# Patient Record
Sex: Female | Born: 1980 | Race: White | Hispanic: No | Marital: Married | Smoking: Never smoker
Health system: Southern US, Community
[De-identification: ages and names within clinical notes are randomized; demographics above are authoritative.]

## PROBLEM LIST (undated history)

## (undated) HISTORY — PX: KNEE SURGERY: SHX244

---

## 2009-05-31 ENCOUNTER — Emergency Department: Payer: Self-pay | Admitting: Emergency Medicine

## 2011-06-27 IMAGING — CR DG CHEST 2V
1 series · 2 of 2 positions shown · non-contrast
Comparison: none

REASON FOR EXAM: COUGH, SOB
COMMENTS:

[Series 1: view not recorded · 0.17mm/px · 2 of 2 slices shown]
[im 1/2]
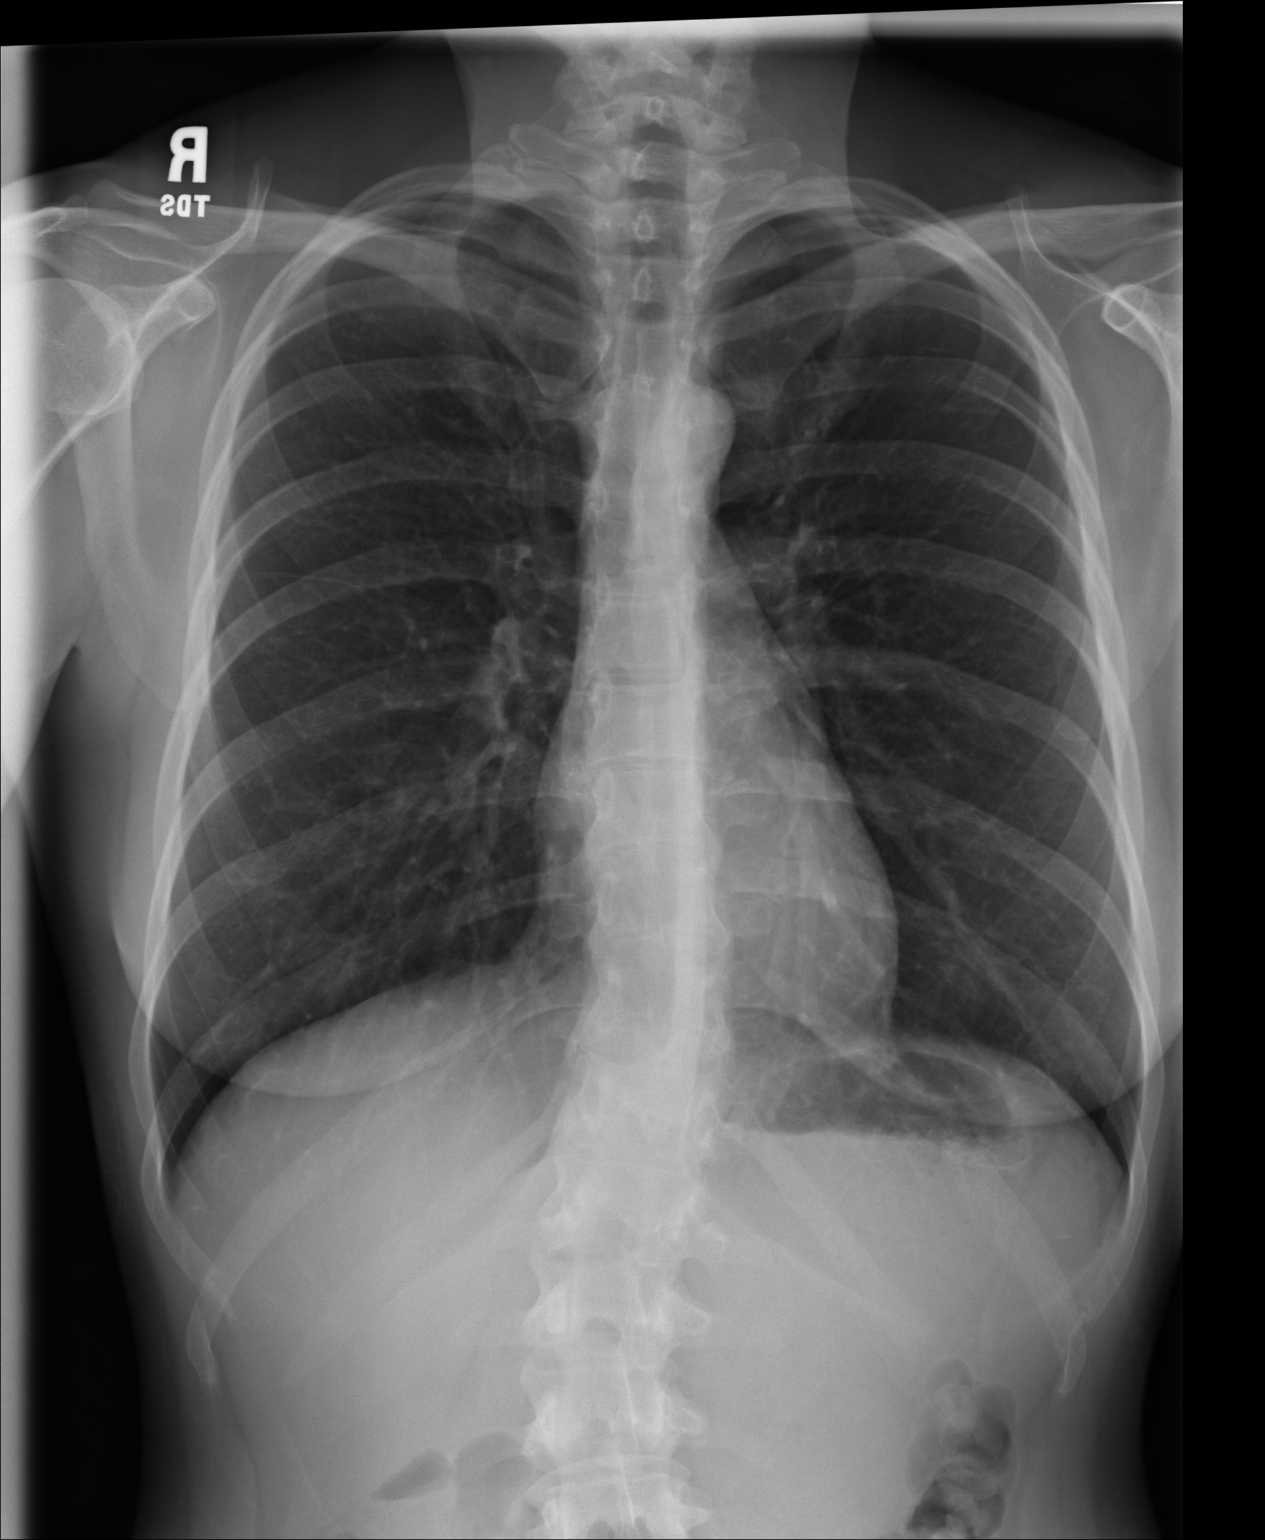
[im 2/2]
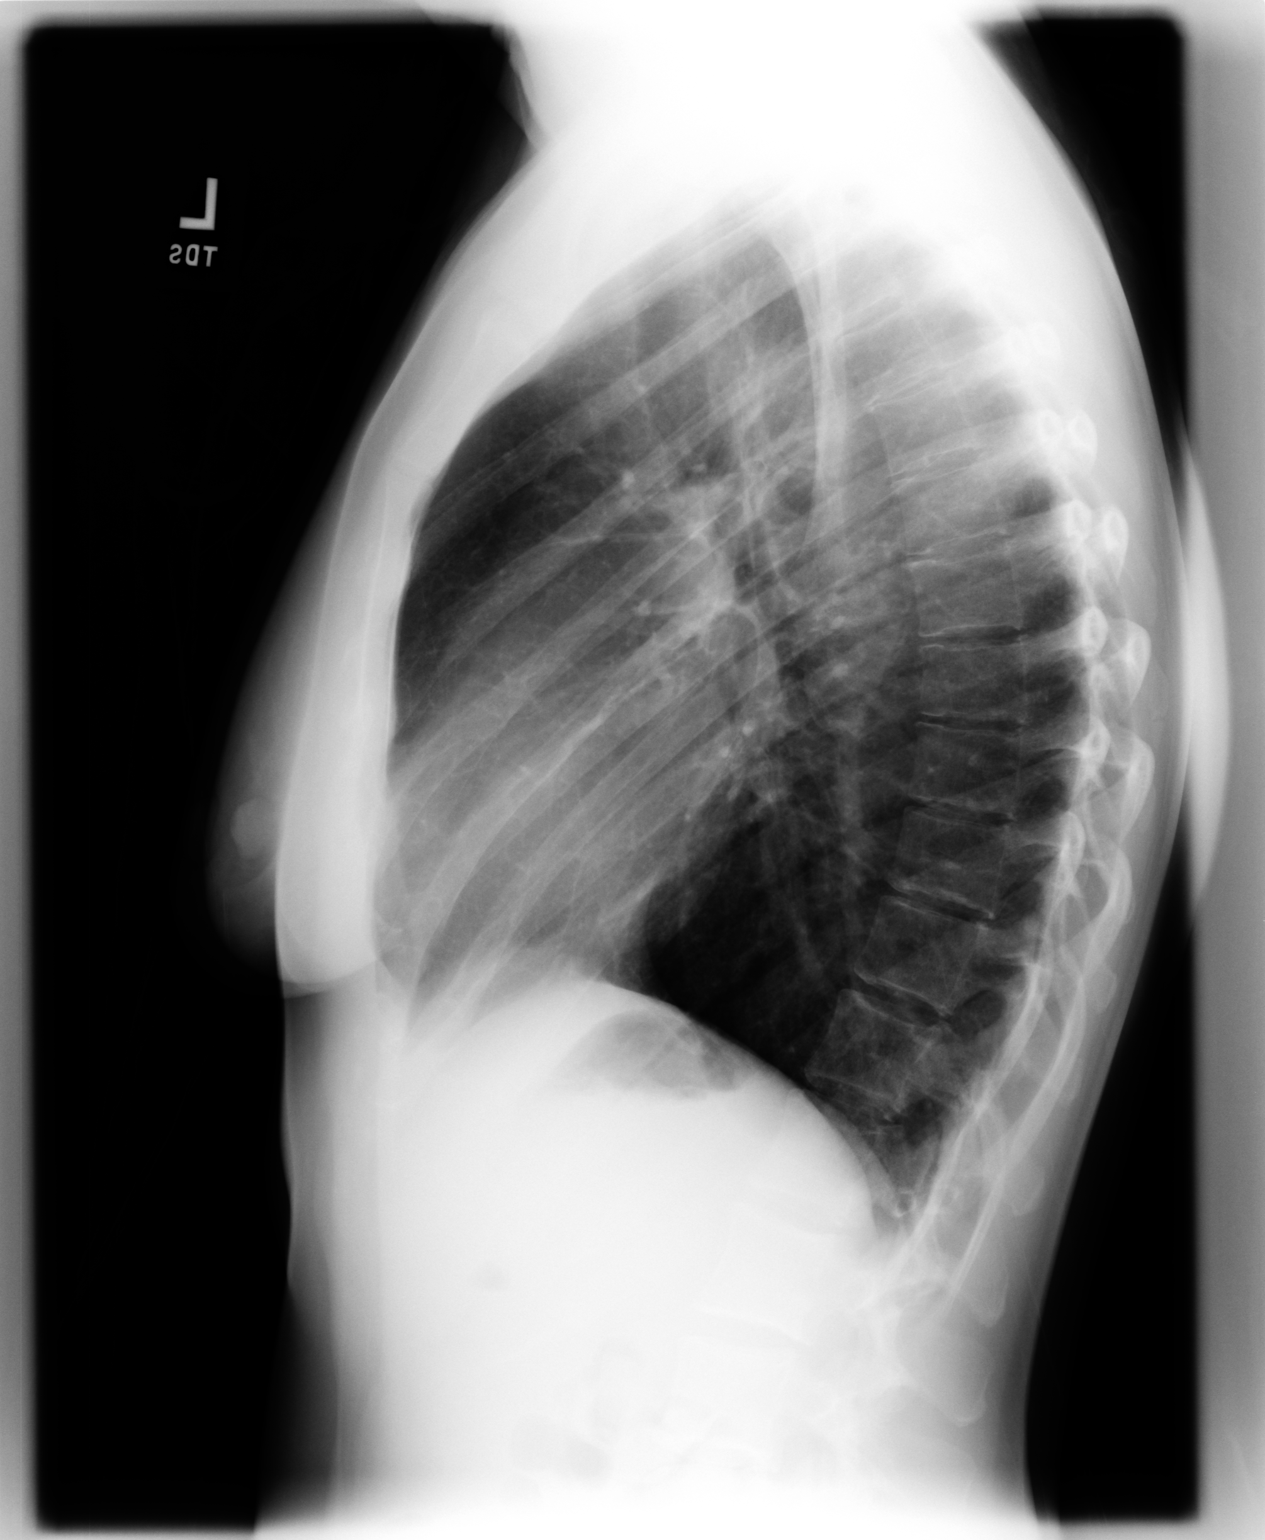

[2 of 2 positions shown; findings below may reference images not displayed]

PROCEDURE:     DXR - DXR CHEST PA (OR AP) AND LATERAL  - May 31, 2009  [DATE]

RESULT:     The lungs are mildly hyperinflated but clear. The heart is
normal in size. The pulmonary vascularity is not engorged. There is no
pleural effusion. The mediastinum is not widened. Gentle curvature of the
thoracolumbar spine is present.
IMPRESSION: There is mild hyperinflation consistent with reactive
airway disease or COPD. I do not see evidence of pneumonia.

## 2017-12-19 ENCOUNTER — Other Ambulatory Visit: Payer: Self-pay | Admitting: Student

## 2017-12-19 DIAGNOSIS — M5412 Radiculopathy, cervical region: Secondary | ICD-10-CM

## 2017-12-19 DIAGNOSIS — R29818 Other symptoms and signs involving the nervous system: Secondary | ICD-10-CM

## 2018-01-03 ENCOUNTER — Ambulatory Visit: Payer: Self-pay

## 2018-01-03 ENCOUNTER — Other Ambulatory Visit: Payer: Self-pay

## 2021-03-24 ENCOUNTER — Ambulatory Visit
Admission: EM | Admit: 2021-03-24 | Discharge: 2021-03-24 | Disposition: A | Discharge status: Home / Self Care | Payer: BC Managed Care – PPO

## 2021-03-24 ENCOUNTER — Encounter: Payer: Self-pay | Admitting: Emergency Medicine

## 2021-03-24 ENCOUNTER — Other Ambulatory Visit: Payer: Self-pay

## 2021-03-24 DIAGNOSIS — H68002 Unspecified Eustachian salpingitis, left ear: Secondary | ICD-10-CM

## 2021-03-24 MED ORDER — FLUTICASONE PROPIONATE 50 MCG/ACT NA SUSP: 2.0000 | Freq: Every day | NASAL | 0 refills | Status: AC

## 2021-03-24 NOTE — ED Provider Notes (Signed)
[facility]    CSN: [id number] Arrival date & time: 03/24/21  1720      History   Chief Complaint Chief Complaint  Patient presents with   Ear Irritation     HPI Dana Hardin is a 40 y.o. femalewho presents due to having L ear irritation since today. Denies any pain. All day has heard crackling in her L ear. Has a slight scratchy throat today, but no rhinitis, cough or nose congestion.     History reviewed. No pertinent past medical history.  There are no problems to display for this patient.   Past Surgical History:  Procedure Laterality Date   KNEE SURGERY Left     OB History   No obstetric history on file.      Home Medications    Prior to Admission medications   Medication Sig Start Date End Date Taking? Authorizing Provider  fluticasone (FLONASE) 50 MCG/ACT nasal spray Place 2 sprays into both nostrils daily. 03/24/21  Yes Rodriguez-Southworth, Nettie Elm, PA-C  levonorgestrel (MIRENA, 52 MG,) 20 MCG/DAY IUD Mirena 20 mcg/24 hours (8 yrs) 52 mg intrauterine device  Take by intrauterine route. 02/26/13  Yes [provider]    Family History History reviewed. No pertinent family history.  Social History Social History   Tobacco Use   Smoking status: Never   Smokeless tobacco: Never  Vaping Use   Vaping Use: Never used  Substance Use Topics   Alcohol use: Yes   Drug use: Never     Allergies   Shellfish-derived products, Butorphanol, and Codeine   Review of Systems Review of Systems  Constitutional:  Negative for chills and fever.  HENT:  Negative for congestion, ear discharge, ear pain, sore throat and trouble swallowing.   Eyes:  Negative for discharge.  Respiratory:  Negative for cough.   Skin:  Negative for rash.    Physical Exam Triage Vital Signs ED Triage Vitals [03/24/21 1739]  Enc Vitals Group     BP 110/62     Pulse Rate (!) 57     Resp      Temp 99 F (37.2 C)     Temp Source Oral     SpO2 98 %      Weight      Height      Head Circumference      Peak Flow      Pain Score      Pain Loc      Pain Edu?      Excl. in GC?    No data found.  Updated Vital Signs BP 110/62 (BP Location: Right Arm)   Pulse (!) 57   Temp 99 F (37.2 C) (Oral)   SpO2 98%   Visual Acuity Right Eye Distance:   Left Eye Distance:   Bilateral Distance:    Right Eye Near:   Left Eye Near:    Bilateral Near:     Physical Exam Vitals and nursing note reviewed.  Constitutional:      General: She is not in acute distress.    Appearance: She is normal weight. She is not toxic-appearing.  HENT:     Head: Normocephalic.     Right Ear: Tympanic membrane, ear canal and external ear normal.     Left Ear: Tympanic membrane and ear canal normal.  Eyes:     General: No scleral icterus.    Conjunctiva/sclera: Conjunctivae normal.  Pulmonary:     Effort: Pulmonary effort is normal.  Musculoskeletal:  Cervical back: Neck supple.  Lymphadenopathy:     Cervical: No cervical adenopathy.  Skin:    General: Skin is warm and dry.  Neurological:     Mental Status: She is alert and oriented to person, place, and time.     Gait: Gait normal.  Psychiatric:        Mood and Affect: Mood normal.        Behavior: Behavior normal.        Thought Content: Thought content normal.        Judgment: Judgment normal.     UC Treatments / Results  Labs (all labs ordered are listed, but only abnormal results are displayed) Labs Reviewed - No data to display  EKG   Radiology No results found.  Procedures Procedures (including critical care time)  Medications Ordered in UC Medications - No data to display  Initial Impression / Assessment and Plan / UC Course  I have reviewed the triage vital signs and the nursing notes. L eustachian tube dysfunction I placed her on Flonase as noted.    Final Clinical Impressions(s) / UC Diagnoses   Final diagnoses:  Eustachian catarrh, left   Discharge  Instructions   None    ED Prescriptions     Medication Sig Dispense Auth. Provider   fluticasone (FLONASE) 50 MCG/ACT nasal spray Place 2 sprays into both nostrils daily. 18 g Rodriguez-Southworth, Nettie Elm, PA-C      PDMP not reviewed this encounter.   Garey Ham, PA-C 03/24/21 1820

## 2021-03-24 NOTE — ED Triage Notes (Signed)
Pt c/o left ear irritation that started today. Pt denies any pain.

## 2022-07-28 ENCOUNTER — Other Ambulatory Visit: Payer: Self-pay | Admitting: Registered Nurse

## 2022-07-28 DIAGNOSIS — N63 Unspecified lump in unspecified breast: Secondary | ICD-10-CM

## 2022-09-14 ENCOUNTER — Ambulatory Visit: Payer: BC Managed Care – PPO | Admitting: Dermatology

## 2023-07-07 ENCOUNTER — Other Ambulatory Visit (HOSPITAL_COMMUNITY): Payer: Self-pay
